# Patient Record
Sex: Female | Born: 1935 | Race: White | Hispanic: No | Marital: Married | Smoking: Never smoker
Health system: Southern US, Community
[De-identification: ages and names within clinical notes are randomized; demographics above are authoritative.]

## PROBLEM LIST (undated history)

## (undated) DIAGNOSIS — I10 Essential (primary) hypertension: Secondary | ICD-10-CM

## (undated) DIAGNOSIS — F329 Major depressive disorder, single episode, unspecified: Secondary | ICD-10-CM

## (undated) DIAGNOSIS — E785 Hyperlipidemia, unspecified: Secondary | ICD-10-CM

## (undated) DIAGNOSIS — K219 Gastro-esophageal reflux disease without esophagitis: Secondary | ICD-10-CM

## (undated) DIAGNOSIS — I6529 Occlusion and stenosis of unspecified carotid artery: Secondary | ICD-10-CM

## (undated) DIAGNOSIS — K5900 Constipation, unspecified: Secondary | ICD-10-CM

## (undated) DIAGNOSIS — M810 Age-related osteoporosis without current pathological fracture: Secondary | ICD-10-CM

## (undated) DIAGNOSIS — F419 Anxiety disorder, unspecified: Secondary | ICD-10-CM

## (undated) DIAGNOSIS — I714 Abdominal aortic aneurysm, without rupture, unspecified: Secondary | ICD-10-CM

## (undated) DIAGNOSIS — R55 Syncope and collapse: Secondary | ICD-10-CM

## (undated) DIAGNOSIS — M792 Neuralgia and neuritis, unspecified: Secondary | ICD-10-CM

## (undated) DIAGNOSIS — F32A Depression, unspecified: Secondary | ICD-10-CM

## (undated) DIAGNOSIS — G8220 Paraplegia, unspecified: Secondary | ICD-10-CM

## (undated) DIAGNOSIS — I712 Thoracic aortic aneurysm, without rupture, unspecified: Secondary | ICD-10-CM

## (undated) DIAGNOSIS — G8929 Other chronic pain: Secondary | ICD-10-CM

## (undated) DIAGNOSIS — I2510 Atherosclerotic heart disease of native coronary artery without angina pectoris: Secondary | ICD-10-CM

## (undated) DIAGNOSIS — J189 Pneumonia, unspecified organism: Secondary | ICD-10-CM

## (undated) DIAGNOSIS — M159 Polyosteoarthritis, unspecified: Secondary | ICD-10-CM

## (undated) DIAGNOSIS — M130 Polyarthritis, unspecified: Secondary | ICD-10-CM

## (undated) DIAGNOSIS — G250 Essential tremor: Secondary | ICD-10-CM

## (undated) DIAGNOSIS — I739 Peripheral vascular disease, unspecified: Secondary | ICD-10-CM

## (undated) DIAGNOSIS — T7840XA Allergy, unspecified, initial encounter: Secondary | ICD-10-CM

## (undated) DIAGNOSIS — K5641 Fecal impaction: Secondary | ICD-10-CM

## (undated) DIAGNOSIS — N390 Urinary tract infection, site not specified: Secondary | ICD-10-CM

## (undated) DIAGNOSIS — N319 Neuromuscular dysfunction of bladder, unspecified: Secondary | ICD-10-CM

## (undated) DIAGNOSIS — R109 Unspecified abdominal pain: Secondary | ICD-10-CM

## (undated) HISTORY — DX: Polyosteoarthritis, unspecified: M15.9

## (undated) HISTORY — DX: Thoracic aortic aneurysm, without rupture: I71.2

## (undated) HISTORY — DX: Abdominal aortic aneurysm, without rupture, unspecified: I71.40

## (undated) HISTORY — DX: Syncope and collapse: R55

## (undated) HISTORY — DX: Allergy, unspecified, initial encounter: T78.40XA

## (undated) HISTORY — DX: Polyarthritis, unspecified: M13.0

## (undated) HISTORY — DX: Other chronic pain: G89.29

## (undated) HISTORY — DX: Age-related osteoporosis without current pathological fracture: M81.0

## (undated) HISTORY — DX: Urinary tract infection, site not specified: N39.0

## (undated) HISTORY — DX: Gastro-esophageal reflux disease without esophagitis: K21.9

## (undated) HISTORY — DX: Unspecified abdominal pain: R10.9

## (undated) HISTORY — DX: Neuromuscular dysfunction of bladder, unspecified: N31.9

## (undated) HISTORY — DX: Essential tremor: G25.0

## (undated) HISTORY — DX: Major depressive disorder, single episode, unspecified: F32.9

## (undated) HISTORY — DX: Thoracic aortic aneurysm, without rupture, unspecified: I71.20

## (undated) HISTORY — DX: Paraplegia, unspecified: G82.20

## (undated) HISTORY — DX: Peripheral vascular disease, unspecified: I73.9

## (undated) HISTORY — DX: Atherosclerotic heart disease of native coronary artery without angina pectoris: I25.10

## (undated) HISTORY — DX: Occlusion and stenosis of unspecified carotid artery: I65.29

## (undated) HISTORY — DX: Anxiety disorder, unspecified: F41.9

## (undated) HISTORY — DX: Pneumonia, unspecified organism: J18.9

## (undated) HISTORY — DX: Hyperlipidemia, unspecified: E78.5

## (undated) HISTORY — DX: Neuralgia and neuritis, unspecified: M79.2

## (undated) HISTORY — DX: Fecal impaction: K56.41

## (undated) HISTORY — DX: Abdominal aortic aneurysm, without rupture: I71.4

## (undated) HISTORY — DX: Constipation, unspecified: K59.00

## (undated) HISTORY — DX: Depression, unspecified: F32.A

## (undated) HISTORY — DX: Essential (primary) hypertension: I10

## (undated) HISTORY — PX: OTHER SURGICAL HISTORY: SHX169

---

## 2008-04-30 ENCOUNTER — Emergency Department (HOSPITAL_COMMUNITY): Admission: EM | Admit: 2008-04-30 | Discharge: 2008-05-01 | Payer: Self-pay | Admitting: Emergency Medicine

## 2010-03-12 IMAGING — CT CT HEAD W/O CM
3 of 4 series · 17 of 30 positions shown, 19 images · non-contrast
Comparison: None.

CLINICAL DATA: Dizziness.  Recent AAA repair.

CT HEAD WITHOUT CONTRAST
TECHNIQUE: Contiguous axial images were obtained from the base of
the skull through the vertex without contrast.

[Series 2: brain · axial · 0.49mm/px · z∈[+133,+237]mm · 7 of 28 slices shown, 9 images (1 of 2)]
[im 4/28  brain]
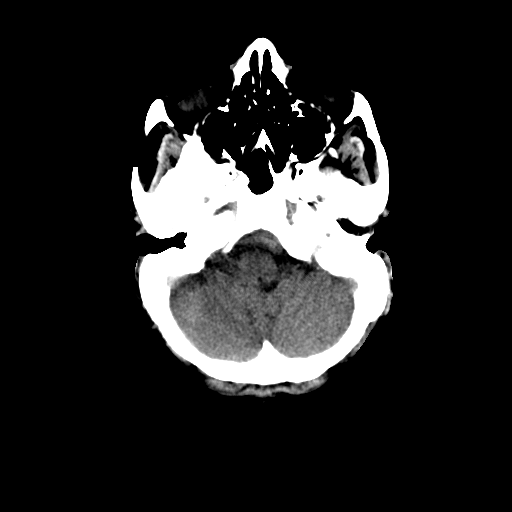
[im 4/28  bone]
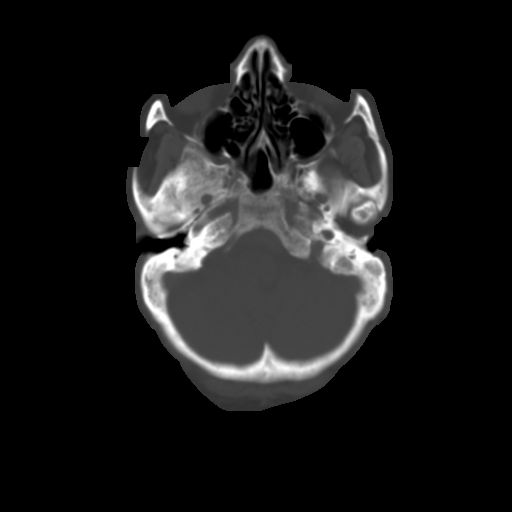
[im 7/28  brain]
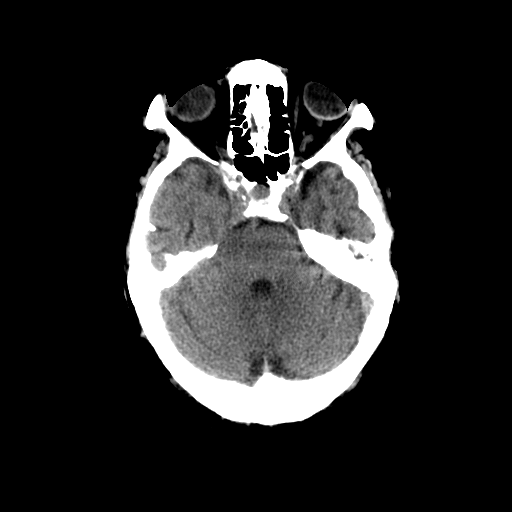
[im 11/28  brain]
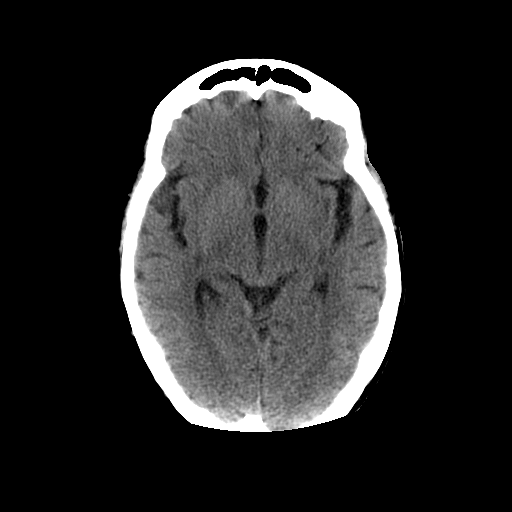
[im 14/28  brain]
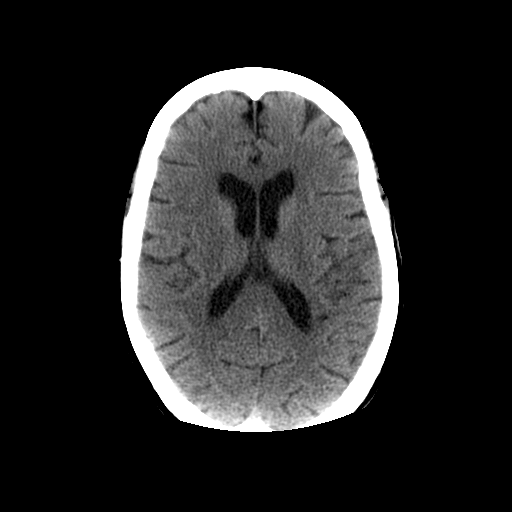
[im 17/28  brain]
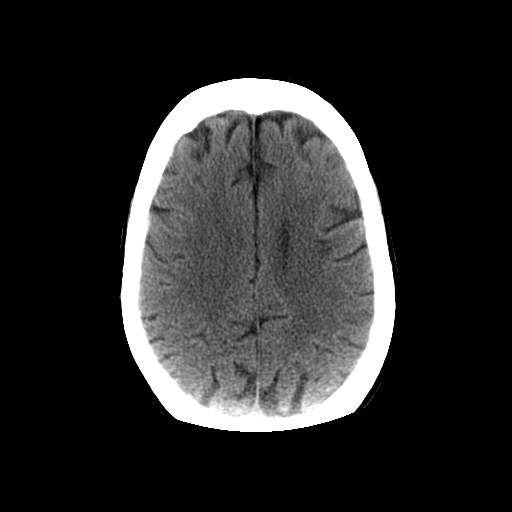
[im 17/28  bone]
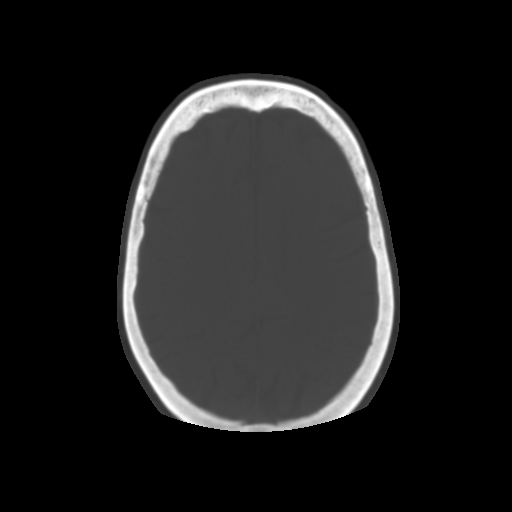
[im 21/28  brain]
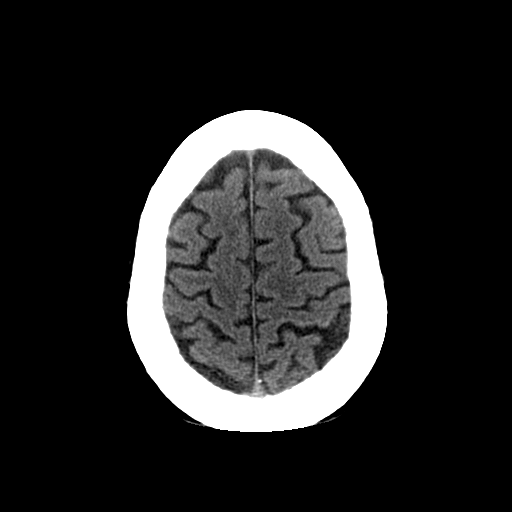
[im 24/28  brain]
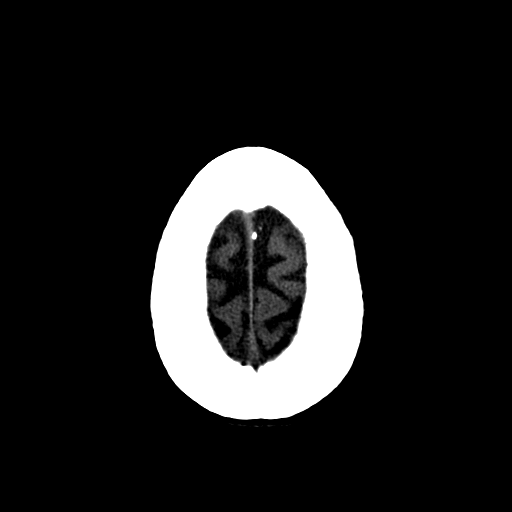

[Series 3: recon 2: brain · axial · 0.49mm/px · z∈[+133,+237]mm · 7 of 28 slices shown]
[im 4/28  brain]
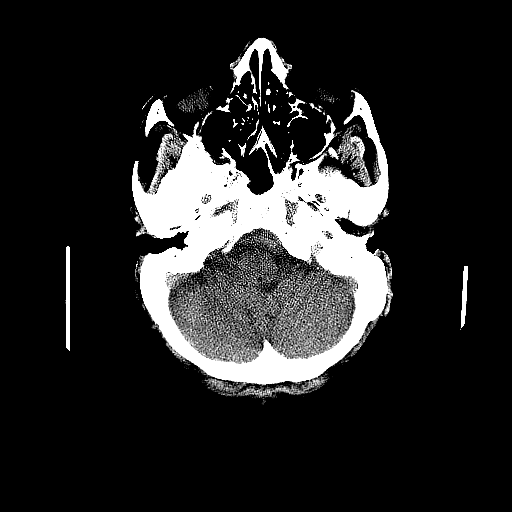
[im 7/28  brain]
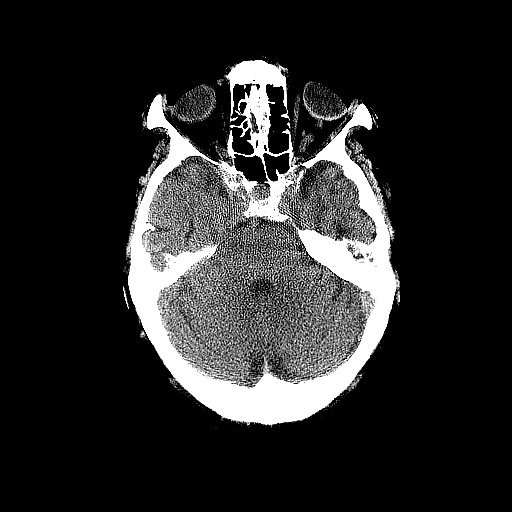
[im 11/28  brain]
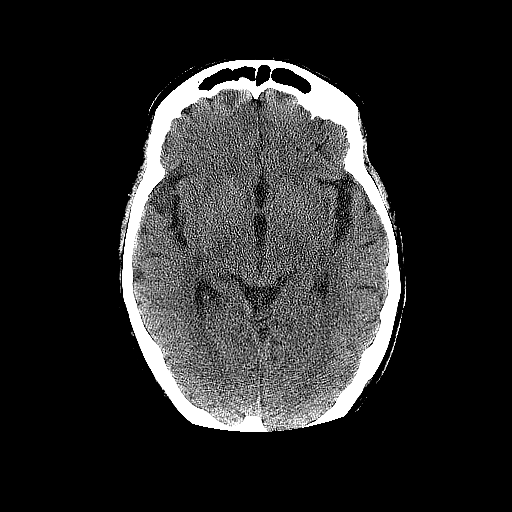
[im 14/28  brain]
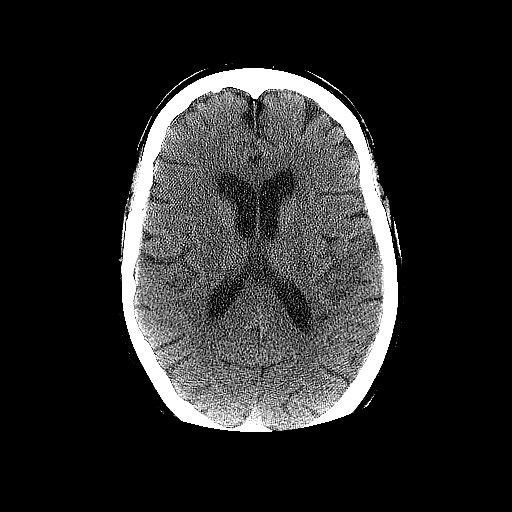
[im 17/28  brain]
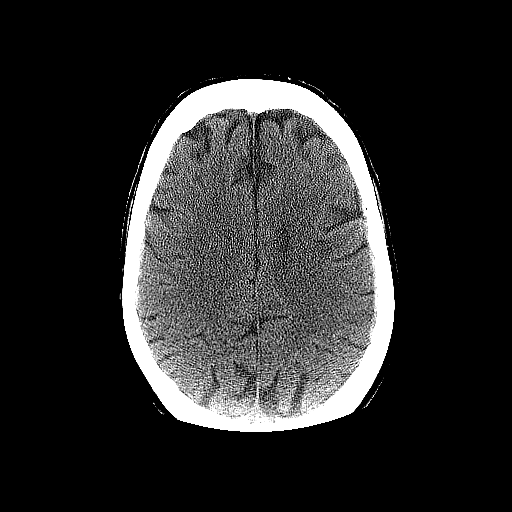
[im 21/28  brain]
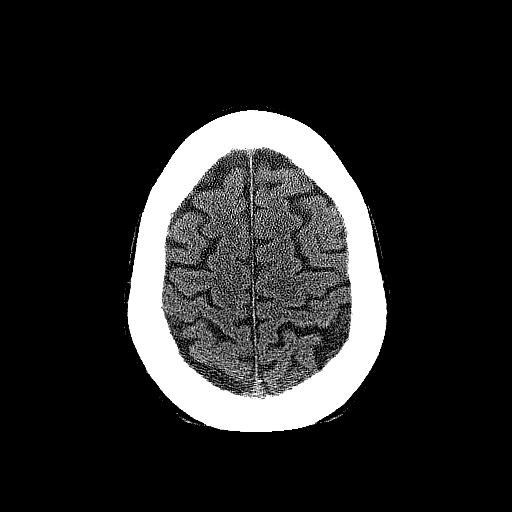
[im 24/28  brain]
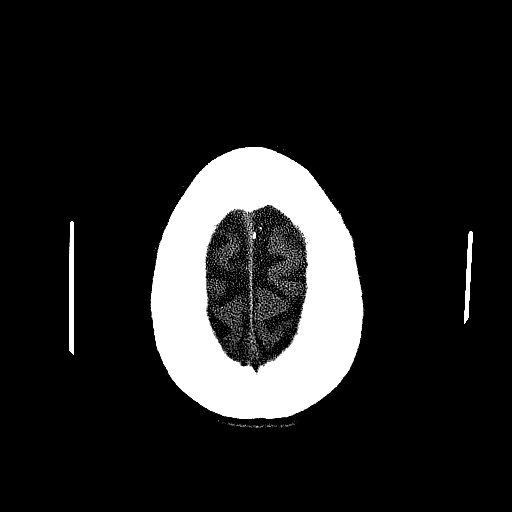

[Series 4: brain · axial · 0.49mm/px · z∈[+164,+205]mm · 3 of 20 slices shown (2 of 2)]
[im 4/20  brain]
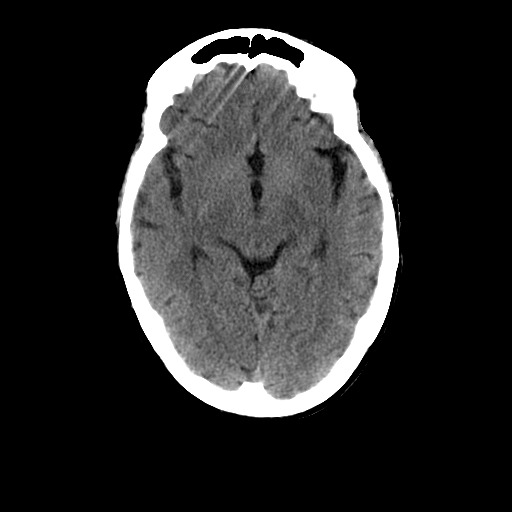
[im 8/20  brain]
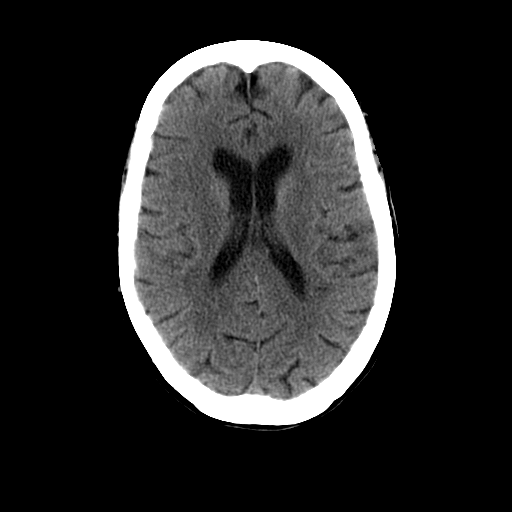
[im 12/20  brain]
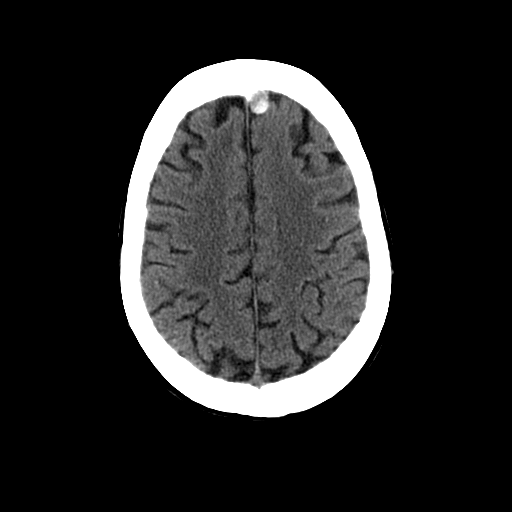

[17 of 30 positions shown; findings below may reference images not displayed]

FINDINGS: Ventricle size is normal.  There is no intracranial
hemorrhage.  There is a chronic infarct in the left internal
capsule and there is mild chronic ischemia in the white matter.  No
acute infarct.

There is a calcified dural-based mass in the left frontal lobe
measuring 10 mm.  This is most likely a calcified meningioma.  No
acute skull abnormality.
IMPRESSION: No acute intracranial abnormality.

10 mm dural based calcified mass in the left frontal lobe
compatible with a meningioma.

## 2010-03-12 IMAGING — US US ABDOMEN COMPLETE
1 series · 7 of 7 positions shown · non-contrast
Comparison: None.

CLINICAL DATA: Abdominal pain.  Thoracic aortic aneurysm repair 2
months ago.

COMPLETE ABDOMINAL ULTRASOUND

[Series 1: us abdomen complete · 7 acquisitions, 7 frames shown]
[im 1/7]
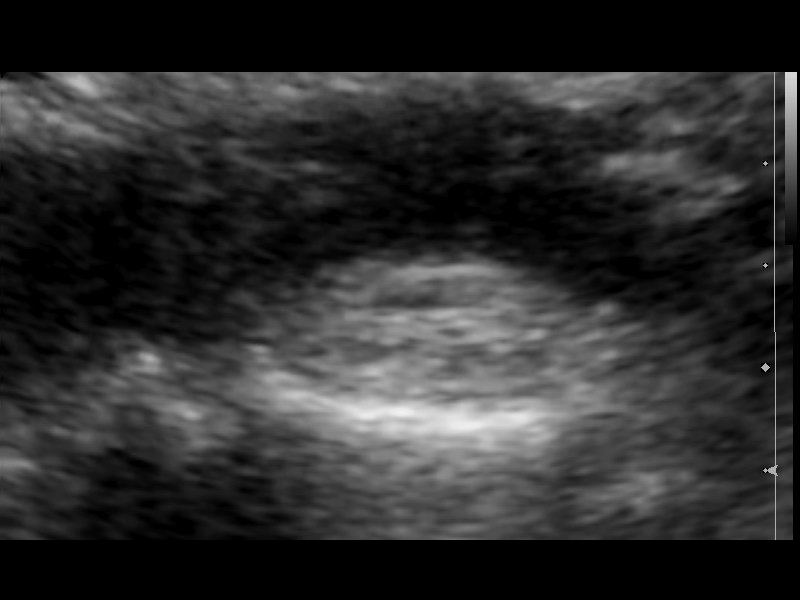
[im 2/7]
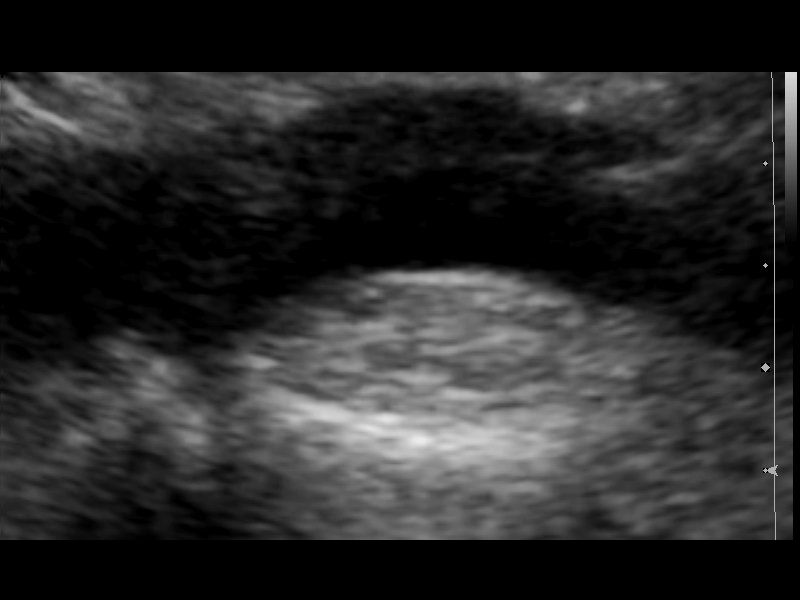
[im 3/7]
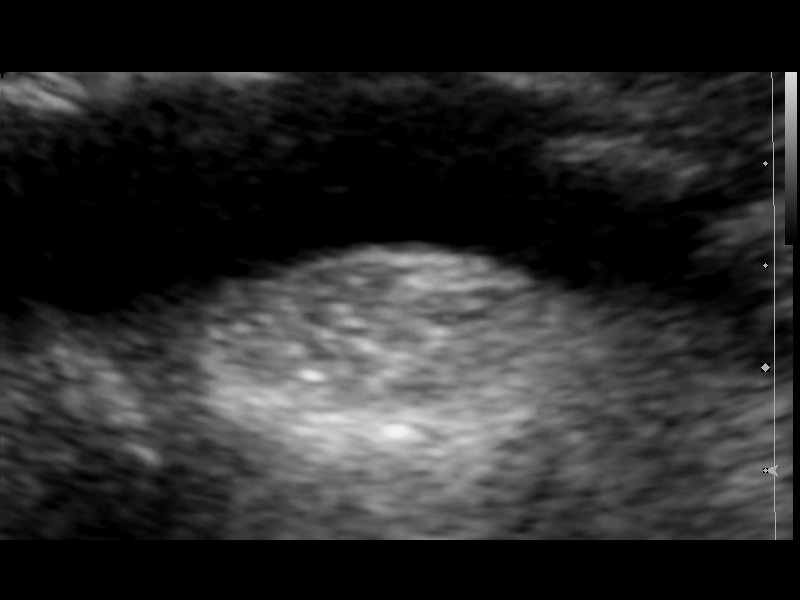
[im 4/7]
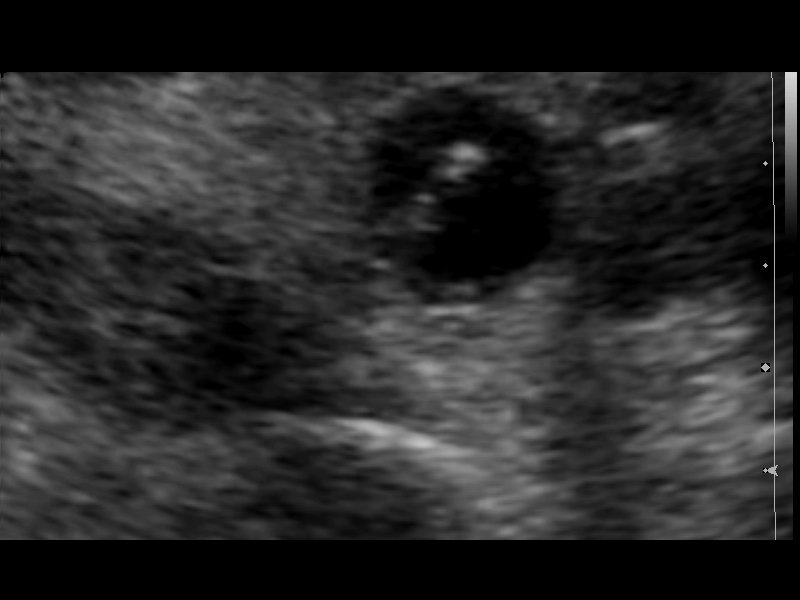
[im 5/7]
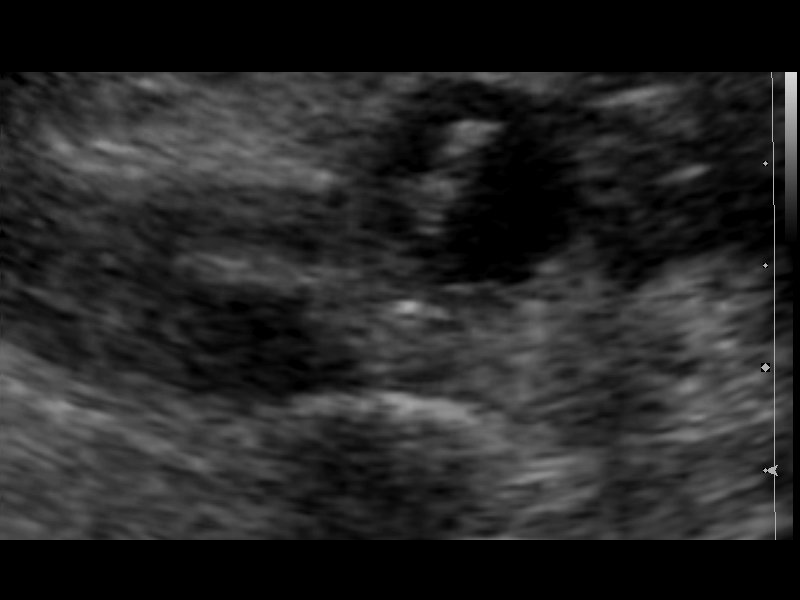
[im 6/7]
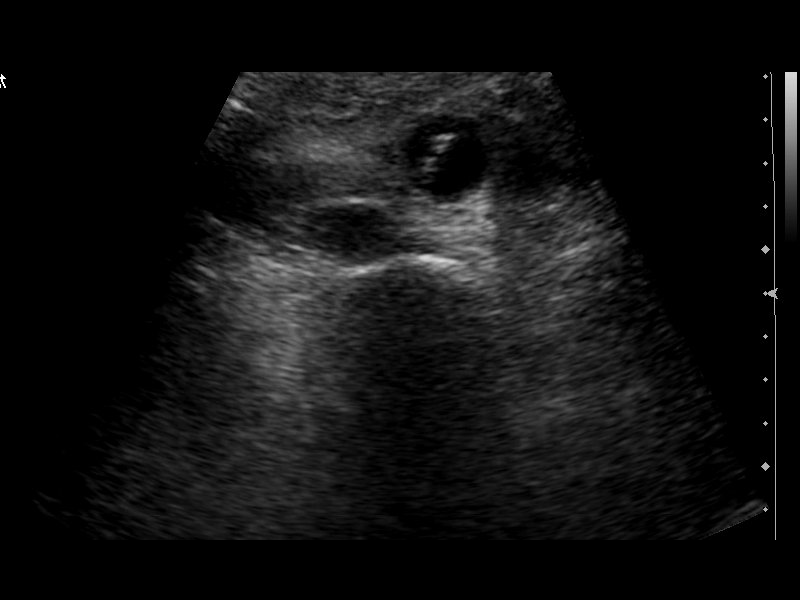
[im 7/7]
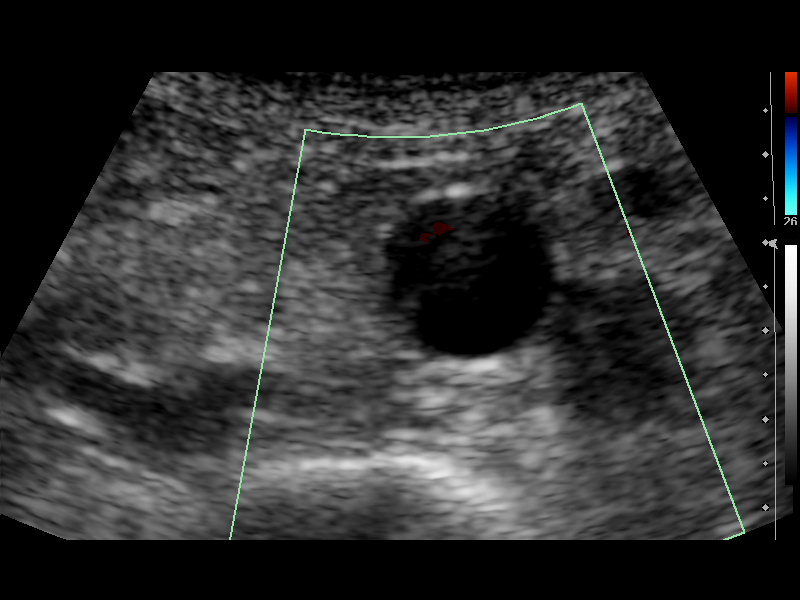

[7 of 7 positions shown; findings below may reference images not displayed]

FINDINGS: Gallbladder:  Negative.  There are no gallstones or gallbladder
wall thickening.  The gallbladder wall is 3.0 mm.

Common bile duct:  3.5 mm.

Liver:  Multiple hepatic cysts are present.  The largest measures
3.8 x 4.2 cm.

IVC:  Negative.

Pancreas:  Limited visualization of the pancreas due to bowel gas.

Spleen:  Negative.

Right Kidney:  10.2 cm.  8 mm upper pole cyst.

Left Kidney:  8.3 cm with cortical atrophy.  There is no
obstruction.

Abdominal aorta:  The abdominal aorta measures approximately 19 x
21 mm in diameter.  There is an apparent aortic dissection of  the
abdominal aorta.  There is an intimal flap noted in the   aorta.
Doppler reveals differential flow within the false lumen which does
appear to be patent.

Left pleural effusion is noted.
IMPRESSION: Negative for gallstones.

Dissection of the abdominal aorta.  No aneurysm is identified.
Correlation with prior imaging and surgery is suggested as the
patient does have history of recent thoracic aortic surgery and
this may be a chronic dissection.

Left pleural effusion.

## 2010-05-04 LAB — CBC
HCT: 37.7 % (ref 36.0–46.0)
MCV: 89.9 fL (ref 78.0–100.0)
Platelets: 276 10*3/uL (ref 150–400)
RDW: 16.3 % — ABNORMAL HIGH (ref 11.5–15.5)

## 2010-05-04 LAB — BASIC METABOLIC PANEL
BUN: 15 mg/dL (ref 6–23)
CO2: 24 mEq/L (ref 19–32)
Chloride: 100 mEq/L (ref 96–112)
Glucose, Bld: 115 mg/dL — ABNORMAL HIGH (ref 70–99)
Potassium: 4 mEq/L (ref 3.5–5.1)

## 2010-05-04 LAB — DIFFERENTIAL
Basophils Absolute: 0 10*3/uL (ref 0.0–0.1)
Eosinophils Absolute: 0.2 10*3/uL (ref 0.0–0.7)
Eosinophils Relative: 2 % (ref 0–5)
Lymphs Abs: 2.7 10*3/uL (ref 0.7–4.0)

## 2013-01-31 ENCOUNTER — Other Ambulatory Visit: Payer: Self-pay | Admitting: *Deleted

## 2013-01-31 MED ORDER — HYDROCODONE-ACETAMINOPHEN 5-325 MG PO TABS: 1.0000 | ORAL_TABLET | Freq: Four times a day (QID) | ORAL | Status: DC | PRN

## 2013-01-31 MED ORDER — LORAZEPAM 0.5 MG PO TABS: 0.5000 mg | ORAL_TABLET | Freq: Every day | ORAL | Status: DC

## 2013-02-04 ENCOUNTER — Encounter: Payer: Self-pay | Admitting: Adult Health

## 2013-02-04 ENCOUNTER — Non-Acute Institutional Stay (SKILLED_NURSING_FACILITY): Payer: Medicare Other | Admitting: Adult Health

## 2013-02-04 DIAGNOSIS — K5900 Constipation, unspecified: Secondary | ICD-10-CM | POA: Insufficient documentation

## 2013-02-04 DIAGNOSIS — J189 Pneumonia, unspecified organism: Secondary | ICD-10-CM | POA: Insufficient documentation

## 2013-02-04 DIAGNOSIS — I10 Essential (primary) hypertension: Secondary | ICD-10-CM | POA: Insufficient documentation

## 2013-02-04 DIAGNOSIS — E785 Hyperlipidemia, unspecified: Secondary | ICD-10-CM

## 2013-02-04 DIAGNOSIS — F418 Other specified anxiety disorders: Secondary | ICD-10-CM | POA: Insufficient documentation

## 2013-02-04 DIAGNOSIS — F341 Dysthymic disorder: Secondary | ICD-10-CM

## 2013-02-04 DIAGNOSIS — G839 Paralytic syndrome, unspecified: Secondary | ICD-10-CM

## 2013-02-04 DIAGNOSIS — J309 Allergic rhinitis, unspecified: Secondary | ICD-10-CM | POA: Insufficient documentation

## 2013-02-04 DIAGNOSIS — G8220 Paraplegia, unspecified: Secondary | ICD-10-CM | POA: Insufficient documentation

## 2013-02-04 DIAGNOSIS — G8929 Other chronic pain: Secondary | ICD-10-CM | POA: Insufficient documentation

## 2013-02-04 DIAGNOSIS — K219 Gastro-esophageal reflux disease without esophagitis: Secondary | ICD-10-CM

## 2013-02-04 MED ORDER — GABAPENTIN 300 MG PO CAPS: 300.0000 mg | ORAL_CAPSULE | Freq: Three times a day (TID) | ORAL | Status: AC

## 2013-02-04 MED ORDER — MELATONIN 1 MG PO CAPS: 1.0000 mg | ORAL_CAPSULE | Freq: Every day | ORAL | Status: AC

## 2013-02-04 NOTE — Progress Notes (Signed)
Patient ID: Lydia Mack, female   DOB: 12-14-1923, 80 y.o.   MRN: [id number]     [facility]  Allergies  Allergen Reactions  . Cortisone   . Nitroglycerin      Chief Complaint  Patient presents with  . Hospitalization Follow-up    HPI:  She has been hospitalized for pneumonia. She has completed her vantin therapy. She is here for short term rehab with her goal to return back home. She has a few fine crackles in the right lower lung; I have given her the incentive spirometry.    Past Medical History  Diagnosis Date  . Hypertension   . Hyperlipidemia   . GERD (gastroesophageal reflux disease)   . Depression   . Allergy   . Anxiety   . CAD (coronary artery disease)   . PVD (peripheral vascular disease)   . Paraplegia at T9 level   . Neurogenic bladder   . Intrathoracic aortic aneurysm   . Chronic abdominal pain   . Constipation   . Hypertrophic polyarthritis   . Osteoporosis   . Syncope   . UTI (lower urinary tract infection)   . Fecal impaction   . Carotid stenosis   . Neuropathic pain syndrome (non-herpetic)   . Essential tremor   . Pneumonia   . AAA (abdominal aortic aneurysm)     Past Surgical History  Procedure Laterality Date  . Emergent repair intrathoracic aneurysm      VITAL SIGNS BP 113/67  Pulse 73  Ht 5\' 7"  (1.702 m)  Wt 198 lb (89.812 kg)  BMI 31.00 kg/m2   Patient's Medications  New Prescriptions   No medications on file  Previous Medications   ALBUTEROL (PROVENTIL HFA;VENTOLIN HFA) 108 (90 BASE) MCG/ACT INHALER    Inhale 2 puffs into the lungs every 6 (six) hours as needed for wheezing or shortness of breath.   ASPIRIN 81 MG TABLET    Take 81 mg by mouth daily.   CHOLECALCIFEROL (VITAMIN D) 1000 UNITS TABLET    Take 2,000 Units by mouth daily.   CITALOPRAM (CELEXA) 10 MG TABLET    Take 10 mg by mouth daily.   COD LIVER OIL 1000 MG CAPS    Take 1,000 mg by mouth daily.   COENZYME Q10 10 MG CAPSULE    Take 100 mg by mouth 3 (three)  times daily.   FLUTICASONE (FLONASE) 50 MCG/ACT NASAL SPRAY    Place 1 spray into both nostrils daily.   GABAPENTIN (NEURONTIN) 300 MG CAPSULE    Take 300 mg by mouth 4 (four) times daily.   HYDROCODONE-ACETAMINOPHEN (NORCO/VICODIN) 5-325 MG PER TABLET    Take 1 tablet by mouth every 6 (six) hours as needed for moderate pain. Take 1 tablet by mouth every 6 hours as needed for pain *DO NOT EXCEED 4 GM OF TYLENOL IN 24 HOURS*   LISINOPRIL (PRINIVIL,ZESTRIL) 5 MG TABLET    Take 5 mg by mouth 2 (two) times daily.   LORATADINE (CLARITIN) 10 MG TABLET    Take 10 mg by mouth daily as needed for allergies.   LORAZEPAM (ATIVAN) 0.5 MG TABLET    Take 1 tablet (0.5 mg total) by mouth at bedtime. TAKE 1 TABLET BY MOUTH EVERY NIGHT AT BEDTIME AS NEEDED   MULTIPLE VITAMIN (MULTIVITAMIN) TABLET    Take 1 tablet by mouth daily.   OMEPRAZOLE (PRILOSEC) 20 MG CAPSULE    Take 20 mg by mouth 2 (two) times daily before a meal.   POLYETHYLENE  GLYCOL (MIRALAX / GLYCOLAX) PACKET    Take 17 g by mouth 2 (two) times daily.   SENNA (SENOKOT) 8.6 MG TABS TABLET    Take 2 tablets by mouth daily.   SIMETHICONE (MYLICON) 80 MG CHEWABLE TABLET    Chew 320 mg by mouth 2 (two) times daily.   SIMVASTATIN (ZOCOR) 20 MG TABLET    Take 20 mg by mouth daily.   VITAMIN C (ASCORBIC ACID) 500 MG TABLET    Take 500 mg by mouth daily.  Modified Medications   No medications on file  Discontinued Medications   No medications on file    SIGNIFICANT DIAGNOSTIC EXAMS  01-26-13: chest x-ray: 1. Left lateral opacities likely represent atelectasis and scarring with developing pneumonia not entirely excluded 2. Relative similar enlargement and tortuosity of the ascending aorta given differences in technique in the patient with a history of stanford type A thoracic aortic dissection.     LABS REVIEWED  01-26-13: wbc 7.8; hgb 13.0; hct 41.1; plt 264; glucose 107; bun 28; creat 0.78; k+4.7; na++137; liver normal albumin 3.7 01-29-13: wbc 6.8; hgb  10.9; hct 33.4; plt 237; glucose 98; bun 26; creat 0.85; k+4.7; na++ 137     Review of Systems  Constitutional: Positive for malaise/fatigue. Negative for fever.  Respiratory: Negative for cough, shortness of breath and wheezing.   Cardiovascular: Negative for chest pain, palpitations and leg swelling.  Gastrointestinal: Negative for heartburn and constipation.  Musculoskeletal: Negative for back pain and myalgias.  Skin: Negative.   Neurological: Negative for weakness and headaches.  Psychiatric/Behavioral: Negative for depression. The patient is not nervous/anxious.      Physical Exam  Constitutional: She is oriented to person, place, and time. She appears well-developed and well-nourished. No distress.  overweight  Neck: Neck supple. No JVD present.  Cardiovascular: Normal rate, regular rhythm and intact distal pulses.   Respiratory: Effort normal.  Has few fine crackles right lower lung  GI: Soft. Bowel sounds are normal. She exhibits no distension. There is no tenderness.  Musculoskeletal: She exhibits no edema.  Has full range of motion upper extremities; is paraplegic   Neurological: She is alert and oriented to person, place, and time.  Skin: Skin is warm and dry. She is not diaphoretic.  Psychiatric: She has a normal mood and affect.      ASSESSMENT/ PLAN:  1. Pneumonia: has completed therapy I have told her to use her I/S four times daily and she has agreed to this. Will monitor her status.   2. Hypertension: is presently stable will continue her lisinopril 5 mg twice daily she has a history of not taking her medications as ordered.   3. Chronic pain related to her T9 paraplegia: she is presently taking neurontin to 30 mg four times daily; will reduce to three times daily. Will stop the vicodin 5/325 mg every 6 hours as needed will continue to monitor her status   4. Depression with anxiety: will continue celexa 10 mg daily will change her ativan to 0.25 mg every  other night for 2 weeks will then stop; will begin melatonin 1 mg nightly for sleep. Will monitor  5. Constipation: will continue miralax twice daily ans senna 2 tabs daily   6. Genella Rife: will continue prilosec 20 gm twice daily and simethicone 320 mg twice daily and will monitor  7. Dyslipidemia: will continue zocor 20 mg daily  8. Allergic rhinitis: will continue flonase daily and claritin daily as needed   Will stop her  caffeine per her daughter request    Time spent with patient 50 minutes.

## 2013-02-06 ENCOUNTER — Encounter: Payer: Self-pay | Admitting: Internal Medicine

## 2013-02-06 ENCOUNTER — Non-Acute Institutional Stay (SKILLED_NURSING_FACILITY): Payer: Medicare Other | Admitting: Internal Medicine

## 2013-02-06 DIAGNOSIS — G8929 Other chronic pain: Secondary | ICD-10-CM

## 2013-02-06 DIAGNOSIS — R5383 Other fatigue: Secondary | ICD-10-CM

## 2013-02-06 DIAGNOSIS — G839 Paralytic syndrome, unspecified: Secondary | ICD-10-CM

## 2013-02-06 DIAGNOSIS — G8220 Paraplegia, unspecified: Secondary | ICD-10-CM

## 2013-02-06 DIAGNOSIS — F341 Dysthymic disorder: Secondary | ICD-10-CM

## 2013-02-06 DIAGNOSIS — K219 Gastro-esophageal reflux disease without esophagitis: Secondary | ICD-10-CM

## 2013-02-06 DIAGNOSIS — J309 Allergic rhinitis, unspecified: Secondary | ICD-10-CM

## 2013-02-06 DIAGNOSIS — F418 Other specified anxiety disorders: Secondary | ICD-10-CM

## 2013-02-06 DIAGNOSIS — I10 Essential (primary) hypertension: Secondary | ICD-10-CM

## 2013-02-06 DIAGNOSIS — K5900 Constipation, unspecified: Secondary | ICD-10-CM

## 2013-02-06 DIAGNOSIS — R531 Weakness: Secondary | ICD-10-CM | POA: Insufficient documentation

## 2013-02-06 DIAGNOSIS — J189 Pneumonia, unspecified organism: Secondary | ICD-10-CM

## 2013-02-06 DIAGNOSIS — E785 Hyperlipidemia, unspecified: Secondary | ICD-10-CM

## 2013-02-06 DIAGNOSIS — R5381 Other malaise: Secondary | ICD-10-CM

## 2013-02-06 NOTE — Progress Notes (Signed)
Patient ID: Lydia Mack, female   DOB: [birth year], 80 y.o.   MRN: [id number]    [facility]   PCP: No primary provider on file.  Code Status: full code  Allergies  Allergen Reactions  . Cortisone   . Nitroglycerin     Chief Complaint: new admit  HPI:  80 y/o female patient with history of thoracic abdominal aneurysm, T9 paraplegia, HTN, neuropathic pain, neurogenic bladder among others is here for STR after hospital admission from 01/26/13- 01/31/13 with pneumonia and generalized weakness. She has completed her antibiotic therapy She is seen in her room with her husband present. She has some dementia. She mentions being sensitive to "pills". She feels nauseated. Denies any other complaints. Difficult to obtain ROS from patient  Review of Systems  Constitutional: Positive for malaise/fatigue. Negative for fever.  Respiratory: Negative for cough, shortness of breath and wheezing.   Cardiovascular: Negative for chest pain, palpitations and leg swelling.  Gastrointestinal: Negative for heartburn and constipation.  Musculoskeletal: Negative for back pain and myalgias.  Skin: Negative.   Neurological: Negative for weakness and headaches.  Psychiatric/Behavioral: the patient is nervous/anxious.     Past Medical History  Diagnosis Date  . Hypertension   . Hyperlipidemia   . GERD (gastroesophageal reflux disease)   . Depression   . Allergy   . Anxiety   . CAD (coronary artery disease)   . PVD (peripheral vascular disease)   . Paraplegia at T9 level   . Neurogenic bladder   . Intrathoracic aortic aneurysm   . Chronic abdominal pain   . Constipation   . Hypertrophic polyarthritis   . Osteoporosis   . Syncope   . UTI (lower urinary tract infection)   . Fecal impaction   . Carotid stenosis   . Neuropathic pain syndrome (non-herpetic)   . Essential tremor   . Pneumonia   . AAA (abdominal aortic aneurysm)    Past Surgical History  Procedure Laterality Date  .  Emergent repair intrathoracic aneurysm     Social History:   reports that she has never smoked. She does not have any smokeless tobacco history on file. Her alcohol and drug histories are not on file.  No family history on file.  Medications: Patient's Medications  New Prescriptions   No medications on file  Previous Medications   ALBUTEROL (PROVENTIL HFA;VENTOLIN HFA) 108 (90 BASE) MCG/ACT INHALER    Inhale 2 puffs into the lungs every 6 (six) hours as needed for wheezing or shortness of breath.   ASPIRIN 81 MG TABLET    Take 81 mg by mouth daily.   CHOLECALCIFEROL (VITAMIN D) 1000 UNITS TABLET    Take 2,000 Units by mouth daily.   CITALOPRAM (CELEXA) 10 MG TABLET    Take 10 mg by mouth daily.   COD LIVER OIL 1000 MG CAPS    Take 1,000 mg by mouth daily.   COENZYME Q10 10 MG CAPSULE    Take 100 mg by mouth 3 (three) times daily.   FLUTICASONE (FLONASE) 50 MCG/ACT NASAL SPRAY    Place 1 spray into both nostrils daily.   GABAPENTIN (NEURONTIN) 300 MG CAPSULE    Take 1 capsule (300 mg total) by mouth 3 (three) times daily.   LISINOPRIL (PRINIVIL,ZESTRIL) 5 MG TABLET    Take 5 mg by mouth 2 (two) times daily.   LORATADINE (CLARITIN) 10 MG TABLET    Take 10 mg by mouth daily as needed for allergies.   MELATONIN 1 MG  CAPS    Take 1 capsule (1 mg total) by mouth at bedtime.   MULTIPLE VITAMIN (MULTIVITAMIN) TABLET    Take 1 tablet by mouth daily.   OMEPRAZOLE (PRILOSEC) 20 MG CAPSULE    Take 20 mg by mouth 2 (two) times daily before a meal.   POLYETHYLENE GLYCOL (MIRALAX / GLYCOLAX) PACKET    Take 17 g by mouth 2 (two) times daily.   SENNA (SENOKOT) 8.6 MG TABS TABLET    Take 2 tablets by mouth daily.   SIMETHICONE (MYLICON) 80 MG CHEWABLE TABLET    Chew 320 mg by mouth 2 (two) times daily.   SIMVASTATIN (ZOCOR) 20 MG TABLET    Take 20 mg by mouth daily.   VITAMIN C (ASCORBIC ACID) 500 MG TABLET    Take 500 mg by mouth daily.  Modified Medications   No medications on file  Discontinued  Medications   No medications on file     Physical Exam: Filed Vitals:   02/06/13 1538  BP: 120/70  Pulse: 80  Temp: 97.4 F (36.3 C)  Resp: 18  SpO2: 95%   Constitutional: She is oriented to person. She appears well-developed and well-nourished. No distress.  overweight  Neck: Neck supple. No JVD present.  Cardiovascular: Normal rate, regular rhythm and intact distal pulses.   Respiratory: Effort normal. Equal air entry. No wheeze or rhonchi. Bibasilar crackles GI: Soft. Bowel sounds are normal. She exhibits no distension. There is no tenderness.  Musculoskeletal: She exhibits no edema.  Has full range of motion upper extremities, unable to move her lower extremities Neurological: She is alert and awake Skin: Skin is warm and dry. She is not diaphoretic.  Psychiatric: She has a normal mood but appears anxious  Labs reviewed: 01-26-13: wbc 7.8; hgb 13.0; hct 41.1; plt 264; glucose 107; bun 28; creat 0.78; k+4.7; na++137; liver normal albumin 3.7 01-29-13: wbc 6.8; hgb 10.9; hct 33.4; plt 237; glucose 98; bun 26; creat 0.85; k+4.7; na++ 137    Radiological Exams: 01-26-13: chest x-ray: 1. Left lateral opacities likely represent atelectasis and scarring with developing pneumonia not entirely excluded 2. Relative similar enlargement and tortuosity of the ascending aorta given differences in technique in the patient with a history of stanford type A thoracic aortic dissection.    Assessment/Plan  genreralized weakness- in setting of pneumonia. Will have her work with physical and occupational therapy. Fall precautions. Skin care. Encourage po intake  Pneumonia-Has completed her antibiotic therapy. encouraged to use her incentive spirometry. Continue prn albuterol  Depression with anxiety- appears anxious. Has recently had change in her medication with celexa being given at bedtime and melatonin to help regulate her sleep cycle. Caffeine has been avoided. She is also on ativan every  other day to help with her nerves. Will change this to ativan 0.25 mg every day for now  Hypertension- bp has been stable. Continue lisinopril 5 mg twice daily for now  Allergic rhinitis- d/c loratadine. Continue flonase  Constipation- will continue miralax twice daily and senna 2 tabs daily   Genella RifeGerd- will continue prilosec 20 gm twice daily and simethicone 320 mg twice daily for now   Hyperlipidemia- continue her zocor for now  Chronic pain- stable, continue neurontin 300 mg tid. D/c norco  Family/ staff Communication: reviewed care plan with patient and nursing supervisor   Goals of care: return home after STR   Labs/tests ordered: cbc, cmp

## 2013-02-07 ENCOUNTER — Other Ambulatory Visit: Payer: Self-pay | Admitting: *Deleted

## 2013-02-07 MED ORDER — LORAZEPAM 0.5 MG PO TABS: ORAL_TABLET | ORAL | Status: AC

## 2013-02-18 ENCOUNTER — Non-Acute Institutional Stay (SKILLED_NURSING_FACILITY): Payer: Medicare Other | Admitting: Adult Health

## 2013-02-18 DIAGNOSIS — R531 Weakness: Secondary | ICD-10-CM

## 2013-02-18 DIAGNOSIS — R5383 Other fatigue: Secondary | ICD-10-CM

## 2013-02-18 DIAGNOSIS — R5381 Other malaise: Secondary | ICD-10-CM

## 2013-02-18 DIAGNOSIS — J189 Pneumonia, unspecified organism: Secondary | ICD-10-CM

## 2013-02-18 DIAGNOSIS — I10 Essential (primary) hypertension: Secondary | ICD-10-CM

## 2013-02-18 DIAGNOSIS — G8220 Paraplegia, unspecified: Secondary | ICD-10-CM

## 2013-02-18 DIAGNOSIS — G839 Paralytic syndrome, unspecified: Secondary | ICD-10-CM

## 2013-02-20 DIAGNOSIS — M6281 Muscle weakness (generalized): Secondary | ICD-10-CM

## 2013-02-20 DIAGNOSIS — Z8701 Personal history of pneumonia (recurrent): Secondary | ICD-10-CM

## 2013-02-20 DIAGNOSIS — IMO0002 Reserved for concepts with insufficient information to code with codable children: Secondary | ICD-10-CM

## 2013-02-20 DIAGNOSIS — G8220 Paraplegia, unspecified: Secondary | ICD-10-CM

## 2013-02-24 ENCOUNTER — Encounter: Payer: Self-pay | Admitting: Adult Health

## 2013-02-24 NOTE — Progress Notes (Signed)
Patient ID: Lydia Mack, female   DOB: Mar 25, 1923, 80 y.o.   MRN: [id number]     [facility]  Allergies  Allergen Reactions  . Cortisone   . Nitroglycerin      Chief Complaint  Patient presents with  . Discharge Note    HPI:  She had been hospitalized for pneumonia and was admitted to this facility for short term rehab. She will need home health for pt/ot/nursing/home health aid. She will not need dme. She will need her prescriptions to be written.    Past Medical History  Diagnosis Date  . Hypertension   . Hyperlipidemia   . GERD (gastroesophageal reflux disease)   . Depression   . Allergy   . Anxiety   . CAD (coronary artery disease)   . PVD (peripheral vascular disease)   . Paraplegia at T9 level   . Neurogenic bladder   . Intrathoracic aortic aneurysm   . Chronic abdominal pain   . Constipation   . Hypertrophic polyarthritis   . Osteoporosis   . Syncope   . UTI (lower urinary tract infection)   . Fecal impaction   . Carotid stenosis   . Neuropathic pain syndrome (non-herpetic)   . Essential tremor   . Pneumonia   . AAA (abdominal aortic aneurysm)     Past Surgical History  Procedure Laterality Date  . Emergent repair intrathoracic aneurysm      VITAL SIGNS BP 119/78  Pulse 70  Ht 5\' 7"  (1.702 m)  Wt 198 lb (89.812 kg)  BMI 31.00 kg/m2   Patient's Medications  New Prescriptions   No medications on file  Previous Medications   ALBUTEROL (PROVENTIL HFA;VENTOLIN HFA) 108 (90 BASE) MCG/ACT INHALER    Inhale 2 puffs into the lungs every 6 (six) hours as needed for wheezing or shortness of breath.   ASPIRIN 81 MG TABLET    Take 81 mg by mouth daily.   CHOLECALCIFEROL (VITAMIN D) 1000 UNITS TABLET    Take 2,000 Units by mouth daily.   CITALOPRAM (CELEXA) 10 MG TABLET    Take 10 mg by mouth daily.   COD LIVER OIL 1000 MG CAPS    Take 1,000 mg by mouth daily.   COENZYME Q10 10 MG CAPSULE    Take 100 mg by mouth 3 (three) times daily.   FLUTICASONE  (FLONASE) 50 MCG/ACT NASAL SPRAY    Place 1 spray into both nostrils daily.   GABAPENTIN (NEURONTIN) 300 MG CAPSULE    Take 1 capsule (300 mg total) by mouth 3 (three) times daily.   LISINOPRIL (PRINIVIL,ZESTRIL) 5 MG TABLET    Take 5 mg by mouth 2 (two) times daily.   LORATADINE (CLARITIN) 10 MG TABLET    Take 10 mg by mouth daily as needed for allergies.   LORAZEPAM (ATIVAN) 0.5 MG TABLET    Take 1/2 tablet by mouth every night at bedtime to help with anxiety. Hold for sedation   MELATONIN 1 MG CAPS    Take 1 capsule (1 mg total) by mouth at bedtime.   MULTIPLE VITAMIN (MULTIVITAMIN) TABLET    Take 1 tablet by mouth daily.   OMEPRAZOLE (PRILOSEC) 20 MG CAPSULE    Take 20 mg by mouth 2 (two) times daily before a meal.   POLYETHYLENE GLYCOL (MIRALAX / GLYCOLAX) PACKET    Take 17 g by mouth 2 (two) times daily.   SENNA (SENOKOT) 8.6 MG TABS TABLET    Take 2 tablets by mouth daily.  SIMETHICONE (MYLICON) 80 MG CHEWABLE TABLET    Chew 320 mg by mouth 2 (two) times daily.   SIMVASTATIN (ZOCOR) 20 MG TABLET    Take 20 mg by mouth daily.   VITAMIN C (ASCORBIC ACID) 500 MG TABLET    Take 500 mg by mouth daily.  Modified Medications   No medications on file  Discontinued Medications   No medications on file    SIGNIFICANT DIAGNOSTIC EXAMS  01-26-13: chest x-ray: 1. Left lateral opacities likely represent atelectasis and scarring with developing pneumonia not entirely excluded 2. Relative similar enlargement and tortuosity of the ascending aorta given differences in technique in the patient with a history of stanford type A thoracic aortic dissection.     LABS REVIEWED  01-26-13: wbc 7.8; hgb 13.0; hct 41.1; plt 264; glucose 107; bun 28; creat 0.78; k+4.7; na++137; liver normal albumin 3.7 01-29-13: wbc 6.8; hgb 10.9; hct 33.4; plt 237; glucose 98; bun 26; creat 0.85; k+4.7; na++ 137  02-07-13: wbc 6.6 ;hgb 11.2; hct 37.9; mcv 90.4 plt 260 glucose 94; bun 17; creat 0.7 ;k+4.8; na++ 138; liver normal  albumin 3.8     Review of Systems  Constitutional: negative for fatigue . Negative for fever.  Respiratory: Negative for cough, shortness of breath and wheezing.   Cardiovascular: Negative for chest pain, palpitations and leg swelling.  Gastrointestinal: Negative for heartburn and constipation.  Musculoskeletal: Negative for back pain and myalgias.  Skin: Negative.   Neurological: Negative for weakness and headaches.  Psychiatric/Behavioral: Negative for depression. The patient is not nervous/anxious.      Physical Exam  Constitutional: She is oriented to person, place, and time. She appears well-developed and well-nourished. No distress.  overweight  Neck: Neck supple. No JVD present.  Cardiovascular: Normal rate, regular rhythm and intact distal pulses.   Respiratory: Effort normal. Lungs clear   GI: Soft. Bowel sounds are normal. She exhibits no distension. There is no tenderness.  Musculoskeletal: She exhibits no edema.  Has full range of motion upper extremities; is paraplegic   Neurological: She is alert and oriented to person, place, and time.  Skin: Skin is warm and dry. She is not diaphoretic.  Psychiatric: She has a normal mood and affect.        ASSESSMENT/ PLAN:  Will discharge to home with home health for pt/ot/nursing/hom health aid. Will not need dme. Her prescriptions have been written.   Time spent with patient 35 minutes.

## 2013-04-28 ENCOUNTER — Other Ambulatory Visit: Payer: Self-pay | Admitting: Adult Health

## 2019-06-24 DEATH — deceased
